# Patient Record
Sex: Female | Born: 1980 | Race: Black or African American | Hispanic: No | Marital: Single | Smoking: Never smoker
Health system: Southern US, Community
[De-identification: ages and names within clinical notes are randomized; demographics above are authoritative.]

## PROBLEM LIST (undated history)

## (undated) ENCOUNTER — Inpatient Hospital Stay (HOSPITAL_COMMUNITY): Payer: Self-pay

## (undated) DIAGNOSIS — O039 Complete or unspecified spontaneous abortion without complication: Secondary | ICD-10-CM

---

## 2013-04-04 ENCOUNTER — Other Ambulatory Visit: Payer: PRIVATE HEALTH INSURANCE

## 2013-04-04 DIAGNOSIS — Z3201 Encounter for pregnancy test, result positive: Secondary | ICD-10-CM

## 2013-04-05 LAB — OBSTETRIC PANEL
Antibody Screen: NEGATIVE
Eosinophils Absolute: 0 10*3/uL (ref 0.0–0.7)
Eosinophils Relative: 0 % (ref 0–5)
Lymphs Abs: 1.6 10*3/uL (ref 0.7–4.0)
MCH: 29.1 pg (ref 26.0–34.0)
MCV: 86.4 fL (ref 78.0–100.0)
Monocytes Absolute: 0.2 10*3/uL (ref 0.1–1.0)
Monocytes Relative: 6 % (ref 3–12)
Platelets: 207 10*3/uL (ref 150–400)
RBC: 3.74 MIL/uL — ABNORMAL LOW (ref 3.87–5.11)
Rh Type: POSITIVE

## 2013-04-05 LAB — HIV ANTIBODY (ROUTINE TESTING W REFLEX): HIV: NONREACTIVE

## 2013-04-25 ENCOUNTER — Encounter: Payer: Self-pay | Admitting: *Deleted

## 2013-05-02 ENCOUNTER — Encounter: Payer: PRIVATE HEALTH INSURANCE | Admitting: Family Medicine

## 2013-05-09 ENCOUNTER — Encounter: Payer: Self-pay | Admitting: Obstetrics and Gynecology

## 2013-05-09 ENCOUNTER — Ambulatory Visit (INDEPENDENT_AMBULATORY_CARE_PROVIDER_SITE_OTHER): Payer: PRIVATE HEALTH INSURANCE | Admitting: Family

## 2013-05-09 ENCOUNTER — Ambulatory Visit (HOSPITAL_COMMUNITY)
Admission: RE | Admit: 2013-05-09 | Discharge: 2013-05-09 | Disposition: A | Discharge status: Home / Self Care | Payer: Medicaid - Out of State | Source: Ambulatory Visit | Attending: Family | Admitting: Family

## 2013-05-09 VITALS — BP 106/71 | Temp 98.8°F | Ht 63.0 in | Wt 124.8 lb

## 2013-05-09 DIAGNOSIS — O021 Missed abortion: Secondary | ICD-10-CM | POA: Diagnosis not present

## 2013-05-09 DIAGNOSIS — O368390 Maternal care for abnormalities of the fetal heart rate or rhythm, unspecified trimester, not applicable or unspecified: Secondary | ICD-10-CM | POA: Diagnosis present

## 2013-05-09 DIAGNOSIS — Z3491 Encounter for supervision of normal pregnancy, unspecified, first trimester: Secondary | ICD-10-CM | POA: Insufficient documentation

## 2013-05-09 DIAGNOSIS — Z3480 Encounter for supervision of other normal pregnancy, unspecified trimester: Secondary | ICD-10-CM

## 2013-05-09 LAB — POCT URINALYSIS DIP (DEVICE)
Glucose, UA: NEGATIVE mg/dL
Nitrite: NEGATIVE
Urobilinogen, UA: 0.2 mg/dL (ref 0.0–1.0)

## 2013-05-09 MED ORDER — CONCEPT DHA 53.5-38-1 MG PO CAPS: 1.0000 | ORAL_CAPSULE | Freq: Every day | ORAL | Status: DC

## 2013-05-09 NOTE — Progress Notes (Signed)
P=96 . Here for new ob.  Given new patient information. Discussed bmi/ appropriate weight gain. C/o legs tired sometimes when in bed. C/o heartburn

## 2013-05-09 NOTE — Progress Notes (Signed)
Called to ultrasound for fetal demise at 9.6 weeks.  Consulted with Dr. Macon Large, may do expectant management or schedule for D&E.  After discussing with patient and significant other they decide for expectant management.  Will schedule follow-up appt in clinic in 10 days with plan to schedule D&E if no bleeding has occurred.  Bleeding precautions given.  OBSTETRIC <14 WK ULTRASOUND  Technique: Transabdominal ultrasound was performed for evaluation  of the gestation as well as the maternal uterus and adnexal  regions.  Comparison: None.  Intrauterine gestational sac: Visualized/normal in shape.  Yolk sac: Visualized  Embryo: Visualized  Cardiac Activity: Not visualized  Heart Rate: Zero bpm  CRL: 29 mm 9 w 6 d [place] EDC: 12/06/2013  Maternal uterus/Adnexae:  The ovaries are normal.  IMPRESSION:  Intrauterine fetal demise at 9 weeks 6 days.

## 2013-05-09 NOTE — Progress Notes (Signed)
New OB visit; reviewed practice information and OB visit schedule; discussed genetic screening > declined.  New OB labs obtained with pap and GC/CT.  Unable to auscultate heart tones with doppler> sent to [facility] for scan (unable to visualize heart tones> to ultrasound for scan.    Exam   BP 106/71  Temp(Src) 98.8 F (37.1 C)  Ht 5\' 3"  (1.6 m)  Wt 124 lb 12.8 oz (56.609 kg)  BMI 22.11 kg/m2  LMP 02/25/2013 Uterine Size: size equals dates  Pelvic Exam:    Perineum: No Hemorrhoids, Normal Perineum   Vulva: normal   Vagina:  normal mucosa, normal discharge, no palpable nodules   pH: Not done   Cervix: no bleeding following Pap, no cervical motion tenderness and no lesions   Adnexa: normal adnexa and no mass, fullness, tenderness   Bony Pelvis: Adequate  System: Breast:  No nipple retraction or dimpling, No nipple discharge or bleeding, No axillary or supraclavicular adenopathy, Normal to palpation without dominant masses   Skin: normal coloration and turgor, no rashes    Neurologic: negative   Extremities: normal strength, tone, and muscle mass   HEENT neck supple with midline trachea and thyroid without masses   Mouth/Teeth mucous membranes moist, pharynx normal without lesions   Neck supple and no masses   Cardiovascular: regular rate and rhythm, no murmurs or gallops   Respiratory:  appears well, vitals normal, no respiratory distress, acyanotic, normal RR, neck free of mass or lymphadenopathy, chest clear, no wheezing, crepitations, rhonchi, normal symmetric air entry   Abdomen: soft, non-tender; bowel sounds normal; no masses,  no organomegaly   Urinary: urethral meatus normal

## 2013-05-09 NOTE — Progress Notes (Signed)
Attestation of Attending Supervision of Advanced Practitioner (PA/CNM/NP): Evaluation and management procedures were performed by the Advanced Practitioner under my supervision and collaboration.  I have reviewed the Advanced Practitioner's note and chart, and I agree with the management and plan.  Mahesh Sizemore, MD, FACOG Attending Obstetrician & Gynecologist Faculty Practice, [facility]  

## 2013-05-11 LAB — CULTURE, OB URINE: Colony Count: 9000

## 2013-05-16 ENCOUNTER — Encounter: Payer: Self-pay | Admitting: Family

## 2013-05-18 ENCOUNTER — Encounter: Payer: Self-pay | Admitting: Family

## 2013-05-18 ENCOUNTER — Ambulatory Visit (INDEPENDENT_AMBULATORY_CARE_PROVIDER_SITE_OTHER): Payer: PRIVATE HEALTH INSURANCE | Admitting: Family

## 2013-05-18 VITALS — BP 112/73 | HR 100 | Wt 124.1 lb

## 2013-05-18 DIAGNOSIS — O034 Incomplete spontaneous abortion without complication: Secondary | ICD-10-CM

## 2013-05-18 NOTE — Progress Notes (Signed)
Informal [place] for FHR per pt request =  IUP visualized, no cardiac activity observed, no FM observed.  Pt and significant other both viewed real-time [place] with me.

## 2013-05-18 NOTE — Progress Notes (Signed)
  Subjective:    Patient ID: Kristine Howard, female    DOB: October 12, 1980, 32 y.o.   MRN: [id number]  HPI Pt is here for follow-up.  Seen on 05/09/13 and diagnosed with 9.6 wk fetal demise.  Pt desired to have expectant management.  Here today with no report of vaginal bleeding or cramping since last visit.  Pt verbalized uncertainty with proceeding with with D&E, desires to check FHR one additional time.   Review of Systems Pertinent information in HPI    Objective:   Physical Exam  Constitutional: She is oriented to person, place, and time. She appears well-developed and well-nourished. No distress.  HENT:  Head: Normocephalic and atraumatic.  Neck: Normal range of motion. Neck supple. No thyromegaly present.  Abdominal: Bowel sounds are normal.  Neurological: She is alert and oriented to person, place, and time.  Skin: Skin is warm and dry.   See ultrasound note per [facility] (no fetal heart rate)       Assessment & Plan:  9.6 week fetal demise  Plan: Explained to patient. Schedule D&E for next week. Message routed to [place] MUHAMMAD,Asriel Westrup

## 2013-05-18 NOTE — Progress Notes (Signed)
Patient states that she has had no bleeding, or passed any tissue. She does not have any pain.

## 2013-05-21 ENCOUNTER — Encounter (HOSPITAL_COMMUNITY): Payer: Self-pay | Admitting: Anesthesiology

## 2013-05-22 ENCOUNTER — Other Ambulatory Visit (HOSPITAL_COMMUNITY): Payer: Self-pay | Admitting: Obstetrics and Gynecology

## 2013-05-22 ENCOUNTER — Encounter (HOSPITAL_COMMUNITY): Payer: Self-pay | Admitting: Anesthesiology

## 2013-05-22 ENCOUNTER — Encounter (HOSPITAL_COMMUNITY)
Admission: RE | Disposition: A | Discharge status: Home / Self Care | Payer: Self-pay | Source: Ambulatory Visit | Attending: Obstetrics and Gynecology

## 2013-05-22 ENCOUNTER — Ambulatory Visit (HOSPITAL_COMMUNITY)
Admission: RE | Admit: 2013-05-22 | Discharge: 2013-05-22 | Disposition: A | Discharge status: Home / Self Care | Payer: PRIVATE HEALTH INSURANCE | Source: Ambulatory Visit | Attending: Obstetrics and Gynecology | Admitting: Obstetrics and Gynecology

## 2013-05-22 ENCOUNTER — Ambulatory Visit (HOSPITAL_COMMUNITY)
Admission: RE | Admit: 2013-05-22 | Discharge: 2013-05-22 | Disposition: A | Discharge status: Home / Self Care | Payer: Medicaid Other | Source: Ambulatory Visit | Attending: Obstetrics and Gynecology | Admitting: Obstetrics and Gynecology

## 2013-05-22 DIAGNOSIS — O021 Missed abortion: Secondary | ICD-10-CM

## 2013-05-22 SURGERY — DILATION AND EVACUATION, UTERUS

## 2013-05-22 MED ORDER — CHLOROPROCAINE HCL 1 % IJ SOLN
INTRAMUSCULAR | Status: DC | PRN
  Administered 2013-05-22: 30 mL

## 2013-05-22 MED ORDER — KETOROLAC TROMETHAMINE 30 MG/ML IJ SOLN
INTRAMUSCULAR | Status: AC
  Filled 2013-05-22: qty 1

## 2013-05-22 MED ORDER — FENTANYL CITRATE 0.05 MG/ML IJ SOLN
INTRAMUSCULAR | Status: AC
  Filled 2013-05-22: qty 2

## 2013-05-22 MED ORDER — PROPOFOL 10 MG/ML IV EMUL
INTRAVENOUS | Status: AC
  Filled 2013-05-22: qty 20

## 2013-05-22 MED ORDER — ONDANSETRON HCL 4 MG/2ML IJ SOLN
INTRAMUSCULAR | Status: AC
  Filled 2013-05-22: qty 2

## 2013-05-22 MED ORDER — MIDAZOLAM HCL 2 MG/2ML IJ SOLN
INTRAMUSCULAR | Status: AC
  Filled 2013-05-22: qty 2

## 2013-05-22 MED ORDER — CHLOROPROCAINE HCL 1 % IJ SOLN
INTRAMUSCULAR | Status: AC
  Filled 2013-05-22: qty 30

## 2013-05-22 MED ORDER — DOXYCYCLINE HYCLATE 100 MG IV SOLR
200.0000 mg | Freq: Once | INTRAVENOUS | Status: DC
  Filled 2013-05-22: qty 200

## 2013-05-22 MED ORDER — LIDOCAINE HCL (CARDIAC) 20 MG/ML IV SOLN
INTRAVENOUS | Status: AC
  Filled 2013-05-22: qty 5

## 2013-05-22 SURGICAL SUPPLY — 19 items
CATH ROBINSON RED A/P 16FR (CATHETERS) IMPLANT
DECANTER SPIKE VIAL GLASS SM (MISCELLANEOUS) IMPLANT
GLOVE BIOGEL PI IND STRL 6.5 (GLOVE) IMPLANT
GLOVE BIOGEL PI INDICATOR 6.5 (GLOVE)
GLOVE SURG SS PI 6.0 STRL IVOR (GLOVE) IMPLANT
GOWN STRL REIN XL XLG (GOWN DISPOSABLE) IMPLANT
KIT BERKELEY 1ST TRIMESTER 3/8 (MISCELLANEOUS) IMPLANT
NEEDLE SPNL 22GX3.5 QUINCKE BK (NEEDLE) IMPLANT
NS IRRIG 1000ML POUR BTL (IV SOLUTION) IMPLANT
PACK VAGINAL MINOR WOMEN LF (CUSTOM PROCEDURE TRAY) IMPLANT
PAD OB MATERNITY 4.3X12.25 (PERSONAL CARE ITEMS) IMPLANT
PAD PREP 24X48 CUFFED NSTRL (MISCELLANEOUS) IMPLANT
SET BERKELEY SUCTION TUBING (SUCTIONS) IMPLANT
SYR CONTROL 10ML LL (SYRINGE) IMPLANT
TOWEL OR 17X24 6PK STRL BLUE (TOWEL DISPOSABLE) IMPLANT
VACURETTE 10 RIGID CVD (CANNULA) IMPLANT
VACURETTE 7MM CVD STRL WRAP (CANNULA) IMPLANT
VACURETTE 8 RIGID CVD (CANNULA) IMPLANT
VACURETTE 9 RIGID CVD (CANNULA) IMPLANT

## 2013-05-22 NOTE — H&P (Addendum)
Kristine Howard is an 32 y.o. female G1P0010 diagnosed with missed AB on 9/17 at [redacted] weeks GA. Patient failed expectant management without any vaginal bleeding. Patient opted for surgical intervention with D&E on 9/26. Patient requested to have repeat ultrasound performed to confirm missed AB  Pertinent Gynecological History: Menses: flow is moderate Bleeding: monthly Contraception: none DES exposure: denies Blood transfusions: none Sexually transmitted diseases: no past history Previous GYN Procedures: none  Last mammogram: n/a  OB History: G1, P0010   Menstrual History:  Patient's last menstrual period was 02/25/2013.    No past medical history on file.  No past surgical history on file.  No family history on file.  Social History:  reports that she has never smoked. She has never used smokeless tobacco. She reports that she does not drink alcohol or use illicit drugs.  Allergies: No Known Allergies  Prescriptions prior to admission  Medication Sig Dispense Refill  . Prenat-FeFum-FePo-FA-Omega 3 (CONCEPT DHA) 53.5-38-1 MG CAPS Take 1 tablet by mouth daily.  30 capsule  3    Review of Systems  All other systems reviewed and are negative.    Last menstrual period 02/25/2013. Physical Exam   No results found for this or any previous visit (from the past 24 hour(s)).  No results found.  Assessment/Plan: 32 yo G1P0010 with 9 week missed ab here for D&E Following ultrasound which confirmed missed ab (CRL [redacted]w[redacted]d without cardiac activity), patient opted to leave without having procedure done. Spoke with patient's partner in hallway who was unable to convince patient to return in order for me to discuss available treatment options for missed Ab. Patient will be contacted by office staff to discuss options versus rescheduling D&E.  Astou Lada 05/22/2013, 8:46 AM

## 2013-05-24 ENCOUNTER — Telehealth: Payer: Self-pay | Admitting: *Deleted

## 2013-05-24 NOTE — Telephone Encounter (Signed)
[facility] and discussed with her that her provider wants her to come in to be seen since she did not get her surgery.  We discussed she will need an exam and provider will discuss her treatment options.  She denies any bleeding or problems at present and agrees to appointment and plan.

## 2013-05-24 NOTE — Telephone Encounter (Signed)
Message copied by Gerome Apley on Thu May 24, 2013  1:15 PM ------      Message from: CONSTANT, Gigi Gin      Created: Tue May 22, 2013  9:13 AM       Patient opted not to have D&E done today (diagnosed with a 9 week missed abortion on 9/17). She left hospital 30 minutes prior to procedure without speaking to a provider. Please contact patient and schedule appointment in clinic to discuss treatment options for missed abortion. If patient desires to have D&E re-scheduled, she should still be seen by a provider before rescheduling.            Thanks            Clinical cytogeneticist ------

## 2013-05-30 ENCOUNTER — Ambulatory Visit (INDEPENDENT_AMBULATORY_CARE_PROVIDER_SITE_OTHER): Payer: Medicaid Other | Admitting: Obstetrics & Gynecology

## 2013-05-30 ENCOUNTER — Encounter: Payer: Self-pay | Admitting: Obstetrics & Gynecology

## 2013-05-30 VITALS — BP 107/74 | HR 111 | Temp 98.9°F | Ht 65.0 in | Wt 125.5 lb

## 2013-05-30 DIAGNOSIS — O021 Missed abortion: Secondary | ICD-10-CM

## 2013-05-30 MED ORDER — MISOPROSTOL 200 MCG PO TABS: 800.0000 ug | ORAL_TABLET | Freq: Once | ORAL | Status: DC

## 2013-05-30 MED ORDER — IBUPROFEN 800 MG PO TABS: 800.0000 mg | ORAL_TABLET | Freq: Three times a day (TID) | ORAL | Status: DC | PRN

## 2013-05-30 NOTE — Patient Instructions (Signed)
Incomplete Miscarriage  Miscarriages in pregnancy are common. A miscarriage is a pregnancy that has ended before the twentieth week. You have had an incomplete miscarriage. Partial parts of the fetus or placenta (afterbirth) remain behind. Sometimes further treatment is needed. The most common reason for further treatment is continued bleeding (hemorrhage). Tissue left behind may also become infected. Treatment usually is curettage. Curettage for an incomplete abortion is a procedure in which the remaining products of pregnancy are removed. This can be done by a simple sucking procedure (suction curettage). It can also be done by a simple scraping (curettage) of the inside of the uterus (womb). This may be done in the hospital or in the caregiver's office. This is only done when your caregiver knows the pregnancy has ended. This is determined by physical examination and a negative pregnancy test. It may also include an ultrasound to confirm a dead fetus. The ultrasound may also prove that products of the pregnancy remain in the uterus.  If your cervix remains dilated and you are still passing clots and tissue, your caregiver may wish to watch you for a little while. Your caregiver may want to see if you are going to finish passing all of the remaining parts of the pregnancy. If the bleeding continues, they may proceed with curettage.  WHY DO I FEEL THIS WAY  Miscarriages can be a very emotional time for prospective mothers. This is not you or your partner's fault. The miscarriage did not occur because of a lack in you or your partner. Nearly all miscarriages occur because the pregnancy has started off wrongly. At least half of miscarried pregnancies have a chromosomal abnormality (almost always not inherited). Others may have developmental problems with the fetus or placentas. Problems may not show up even when the products miscarried are studied under the microscope. You can usually begin trying for another  pregnancy as soon as your caregiver says it's okay.  HOME CARE INSTRUCTIONS    Your caregiver may order bed rest (this means only getting up to use the bathroom). Your caregiver may allow you to continue light activity. If curettage was not done at this time, but you require further treatment.   Keep track of the number of pads you use each day. Keep track of how saturated (soaked) they are. Record this information.   Do not use tampons. Do not douche or have sexual intercourse until approved by your caregiver.   It is very important to keep all follow-up appointments for re-evaluation and continuing management.   Women who have an Rh negative blood type (ie, A, B, AB, or O negative) need to receive a drug called Rh(D) immune globulin. This medicine helps protect future fetuses against problems that can occur if an Rh negative mother is carrying a baby who is Rh positive.  SEEK IMMEDIATE MEDICAL CARE IF:    You experience severe cramps in your stomach, back, or abdomen.   You run an unexplained temperature (record these).   You pass large clots or tissue (save any tissue for your caregiver to inspect).   Your bleeding increases or you become light-headed, weak, or have fainting episodes.  MAKE SURE YOU:    Understand these instructions.   Will watch your condition.   Will get help right away if you are not doing well or get worse.  Document Released: 08/09/2005 Document Revised: 11/01/2011 Document Reviewed: 03/29/2008  ExitCare Patient Information 2014 [employer].

## 2013-05-30 NOTE — Progress Notes (Signed)
Subjective:     Patient ID: Kristine Howard, female   DOB: Jul 25, 1981, 32 y.o.   MRN: [id number]  HPI Pt was seen in the MAU and diagnosed with a missed abortion at [facility].  She has had sono x2 which confirmed a missed AB about 9 weeks.  Pt ~13weeks by dates.  Pt left MAU without seeing provider.  She has no pregnancy sx at present.     Review of Systems     Objective:   Physical Exam BP 107/74  Pulse 111  Temp(Src) 98.9 F (37.2 C)  Ht 5\' 5"  (1.651 m)  Wt 125 lb 8 oz (56.926 kg)  BMI 20.88 kg/m2  LMP 02/25/2013 Pt in NAD Uterus 7-8 weeks sized     Assessment:     Missed abortion- d/w pt the option of D&C vs cytotec.  Reviewed with her the risks      Plan:     cytotec per vagina x1 repeat in 12hours if no passage of tissue. Motrin 800mg  po q 8 hours prn pain F/u in 2 weeks or sooner prn

## 2013-06-15 ENCOUNTER — Ambulatory Visit: Payer: PRIVATE HEALTH INSURANCE | Admitting: Obstetrics & Gynecology

## 2013-06-19 ENCOUNTER — Other Ambulatory Visit (HOSPITAL_COMMUNITY): Payer: Self-pay | Admitting: Obstetrics

## 2013-06-22 ENCOUNTER — Other Ambulatory Visit (HOSPITAL_COMMUNITY): Payer: Self-pay | Admitting: Obstetrics

## 2013-06-22 ENCOUNTER — Ambulatory Visit (HOSPITAL_COMMUNITY)
Admission: RE | Admit: 2013-06-22 | Discharge: 2013-06-22 | Disposition: A | Discharge status: Home / Self Care | Payer: Medicaid Other | Source: Ambulatory Visit | Attending: Obstetrics | Admitting: Obstetrics

## 2013-06-22 DIAGNOSIS — O021 Missed abortion: Secondary | ICD-10-CM | POA: Insufficient documentation

## 2013-08-03 ENCOUNTER — Inpatient Hospital Stay (HOSPITAL_COMMUNITY): Payer: Medicaid Other

## 2013-08-03 ENCOUNTER — Encounter (HOSPITAL_COMMUNITY): Payer: Self-pay

## 2013-08-03 ENCOUNTER — Inpatient Hospital Stay (HOSPITAL_COMMUNITY)
Admission: AD | Admit: 2013-08-03 | Discharge: 2013-08-03 | Disposition: A | Discharge status: Home / Self Care | Payer: Medicaid Other | Source: Ambulatory Visit | Attending: Obstetrics and Gynecology | Admitting: Obstetrics and Gynecology

## 2013-08-03 DIAGNOSIS — O039 Complete or unspecified spontaneous abortion without complication: Secondary | ICD-10-CM | POA: Insufficient documentation

## 2013-08-03 LAB — CBC
HCT: 31.6 % — ABNORMAL LOW (ref 36.0–46.0)
Hemoglobin: 11.4 g/dL — ABNORMAL LOW (ref 12.0–15.0)
MCHC: 36.1 g/dL — ABNORMAL HIGH (ref 30.0–36.0)
MCV: 82.3 fL (ref 78.0–100.0)
Platelets: 156 10*3/uL (ref 150–400)
RBC: 3.84 MIL/uL — ABNORMAL LOW (ref 3.87–5.11)
RDW: 12.6 % (ref 11.5–15.5)
WBC: 5.4 10*3/uL (ref 4.0–10.5)

## 2013-08-03 MED ORDER — LACTATED RINGERS IV BOLUS (SEPSIS)
1000.0000 mL | Freq: Once | INTRAVENOUS | Status: AC
  Administered 2013-08-03: 1000 mL via INTRAVENOUS

## 2013-08-03 MED ORDER — BUTORPHANOL TARTRATE 1 MG/ML IJ SOLN
1.0000 mg | Freq: Once | INTRAMUSCULAR | Status: AC
  Administered 2013-08-03: 1 mg via INTRAVENOUS
  Filled 2013-08-03: qty 1

## 2013-08-03 MED ORDER — PROMETHAZINE HCL 25 MG/ML IJ SOLN
12.5000 mg | Freq: Once | INTRAMUSCULAR | Status: AC
  Administered 2013-08-03: 12.5 mg via INTRAVENOUS
  Filled 2013-08-03: qty 1

## 2013-08-03 NOTE — MAU Provider Note (Signed)
History     CSN: [id number]  Arrival date and time: 08/03/13 0348   None     Chief Complaint  Patient presents with  . Abdominal Cramping   HPI This is a 32 y.o. G1P0 who presents at [redacted]w[redacted]d by LMP with report of missed abortion, s/p cytotec treatment this evening. Was seen here in September for Missed abortion with fetal pole of 27mm with no cardiac activity. Tried conservative management and failed, so came in for [facility] in September but left before procedure done. Was seen again in Clinic in October to confirm, and [place] again saw a 23mm fetus with no heartbeat. She agreed toCytotec at that time but evidently never took it. Was seen in [facility] for New OB today and again was told she had an 8 wks size Missed abortion. Took Cytotec and came here with pain.   RN Note:  Pt arrived via EMS stating she was seen in office yesterday. Was told that baby did not have heartbeat and given RX for cytotec to be taken at home as well as ibuprofen and phenergan. Pt took cytotec around 2030 and began having some cramping. Pt took ibuprofen at 2300. Cramping became severe around 0000. Small amount of vag bleeding.        OB History   Grav Para Term Preterm Abortions TAB SAB Ect Mult Living   1 0   0  0   0      History reviewed. No pertinent past medical history.  History reviewed. No pertinent past surgical history.  History reviewed. No pertinent family history.  History  Substance Use Topics  . Smoking status: Never Smoker   . Smokeless tobacco: Never Used  . Alcohol Use: No    Allergies: No Known Allergies  Prescriptions prior to admission  Medication Sig Dispense Refill  . ibuprofen (ADVIL,MOTRIN) 800 MG tablet Take 1 tablet (800 mg total) by mouth every 8 (eight) hours as needed for pain.  20 tablet  0  . misoprostol (CYTOTEC) 200 MCG tablet Take 4 tablets (800 mcg total) by mouth once.  8 tablet  0  . promethazine (PHENERGAN) 25 MG tablet Take 25 mg by mouth every 6 (six) hours as  needed for nausea or vomiting.      . Prenat-FeFum-FePo-FA-Omega 3 (CONCEPT DHA) 53.5-38-1 MG CAPS Take 1 tablet by mouth daily.  30 capsule  3    Review of Systems  Constitutional: Negative for fever, chills and malaise/fatigue.  Gastrointestinal: Positive for abdominal pain. Negative for nausea, vomiting, diarrhea and constipation.       Bleeding   Genitourinary: Negative for dysuria.  Neurological: Negative for dizziness.   Physical Exam   Blood pressure 122/63, pulse 90, temperature 98.3 F (36.8 C), temperature source Oral, resp. rate 24, last menstrual period 02/25/2013, SpO2 100.00%.  Physical Exam  Constitutional: She is oriented to person, place, and time. She appears well-developed and well-nourished. No distress.  HENT:  Head: Normocephalic.  Cardiovascular: Normal rate.   Respiratory: Effort normal.  GI: Soft. She exhibits no distension and no mass. There is tenderness (slight over SP area). There is no rebound.  Genitourinary: Uterus normal. Vaginal discharge (small amt of clotted blood, Cervix long and closed) found.  Musculoskeletal: Normal range of motion.  Neurological: She is alert and oriented to person, place, and time.  Skin: Skin is warm and dry.  Psychiatric: She has a normal mood and affect.    MAU Course  Procedures  MDM   Assessment and  Plan  A   SIUP at [redacted]w[redacted]d by LMP, with known Missed Abortion from September at 9 wks  P:  Ultrasound ordered      Report to Gevena Barre CNM  [facility] 08/03/2013, 5:34 AM   Addendum: at 0600  S: Pt denies any pain, light bleeding now  O: VSS  A: completed miscarriage after vaginal cytotec this evening  US Ob Transvaginal  08/03/2013   CLINICAL DATA:  Pelvic cramping.  Status post Cytotec.  EXAM: TRANSVAGINAL OB ULTRASOUND  TECHNIQUE: Transvaginal ultrasound was performed for complete evaluation of the gestation as well as the maternal uterus, adnexal regions, and pelvic cul-de-sac.  COMPARISON:  Pelvic  ultrasound performed 06/22/2013  FINDINGS: Intrauterine gestational sac:  None seen.  Yolk sac:  N/A  Embryo:  N/A  Maternal uterus/adnexae: The uterus is unremarkable in appearance. There is no evidence for retained products of conception.  The ovaries are unremarkable in appearance. The right ovary measures 4.6 x 3.2 x 2.7 cm, while the left ovary measures 2.9 x 1.2 x 1.2 cm. A 3.7 cm right adnexal cyst is likely physiologic in nature. No suspicious adnexal masses are seen.  A small amount of free fluid is seen within the pelvic cul-de-sac.  IMPRESSION: 1. No evidence for retained products of conception. No intrauterine gestational sac seen. 2. 3.7 cm right adnexal cyst is likely physiologic in nature.   Electronically Signed   By: Roanna Raider M.D.   On: 08/03/2013 05:50   P: discharge home, rv'd bleeding precautions Has f/u visit next Friday Pt stable ready for discharge  S.Clinton Dragone, CNM  P:

## 2013-08-03 NOTE — MAU Note (Signed)
Pt states that when she went to the bathroom, she felt like something had passed, but could not see anything because it had gone down the toilet. States that she feels better now.

## 2013-08-03 NOTE — MAU Note (Signed)
Pt arrived via EMS stating she was seen in office yesterday. Was told that baby did not have heartbeat and given RX for cytotec to be taken at home as well as ibuprofen and phenergan. Pt took cytotec around 2030 and began having some cramping. Pt took ibuprofen at 2300. Cramping became severe around 0000. Small amount of vag bleeding.

## 2014-01-13 ENCOUNTER — Encounter (HOSPITAL_COMMUNITY): Payer: Self-pay

## 2014-01-13 ENCOUNTER — Inpatient Hospital Stay (HOSPITAL_COMMUNITY): Payer: Medicaid Other

## 2014-01-13 ENCOUNTER — Inpatient Hospital Stay (HOSPITAL_COMMUNITY)
Admission: AD | Admit: 2014-01-13 | Discharge: 2014-01-13 | Disposition: A | Discharge status: Home / Self Care | Payer: Medicaid Other | Source: Ambulatory Visit | Attending: Obstetrics and Gynecology | Admitting: Obstetrics and Gynecology

## 2014-01-13 DIAGNOSIS — O209 Hemorrhage in early pregnancy, unspecified: Secondary | ICD-10-CM | POA: Insufficient documentation

## 2014-01-13 DIAGNOSIS — R109 Unspecified abdominal pain: Secondary | ICD-10-CM | POA: Insufficient documentation

## 2014-01-13 HISTORY — DX: Complete or unspecified spontaneous abortion without complication: O03.9

## 2014-01-13 LAB — CBC
HCT: 34 % — ABNORMAL LOW (ref 36.0–46.0)
Hemoglobin: 12.2 g/dL (ref 12.0–15.0)
MCH: 29.8 pg (ref 26.0–34.0)
MCHC: 35.9 g/dL (ref 30.0–36.0)
MCV: 82.9 fL (ref 78.0–100.0)
Platelets: 188 10*3/uL (ref 150–400)
RBC: 4.1 MIL/uL (ref 3.87–5.11)
RDW: 13 % (ref 11.5–15.5)
WBC: 4.4 10*3/uL (ref 4.0–10.5)

## 2014-01-13 LAB — WET PREP, GENITAL
CLUE CELLS WET PREP: NONE SEEN
TRICH WET PREP: NONE SEEN

## 2014-01-13 LAB — HCG, QUANTITATIVE, PREGNANCY: hCG, Beta Chain, Quant, S: 4932 m[IU]/mL — ABNORMAL HIGH (ref <5)

## 2014-01-13 NOTE — Discharge Instructions (Signed)
Recurrent Miscarriage  Recurrent miscarriage means that a woman has lost two or more pregnancies in a row. The loss happens before 20 weeks of pregnancy. Primary recurrent miscarriage is with a woman that has never been able to give birth to a child. Secondary recurrent miscarriage is a woman who had a child and then had two or more miscarriages in a row.   CAUSES   · Your parents passed it to you (genetic).  · Chromosomal defects.  · Endocrine disorders. A person's endocrine system includes glands that make hormones.  · Having a lack of progesterone hormone in early pregnancy.  · Thyroid problems.  · Insulin resistance seen in diabetic women and women with Polycystic Ovary Syndrome that is hard to control.  · Abnormalities of the uterus.  · Certain viral and germ (bacterial) infections.  · Autoimmune disorders. This is when the immune system attacks or destroys healthy body tissue.  · Smoking, drinking or taking drugs or medicines.  · Being exposed to certain chemicals or toxins at home or work.  HOME CARE INSTRUCTIONS   · See your caregiver if you have two or more miscarriages.  · Follow the advice for testing and treatment that your caregiver recommends.  · Discuss any concerns or questions with your caregiver.  · Do not smoke or drink alcohol when pregnant.  · Recurrent miscarriages can have a severe emotional and psychological effect on a couple wanting to have children. They may have feelings of anger, blame, guilt and become depressed after losing a pregnancy many times. Join a support group or see a grief counselor if you have emotional problems because of pregnancy loss.  SEEK MEDICAL CARE IF:   · You are or think you are pregnant and have any kind of vaginal spotting or bleeding.  · You develop low abdominal cramps.  · You develop a fever of 102° F (38.9° C) or higher.  · You develop abnormal vaginal discharge.  · You have been or think you have been exposed to a spreadable (contagious) illness, toxins or  chemicals that make you sick to your stomach (nauseated) or throw up (vomit).  · You think you have been exposed to a sexually transmitted disease.  Document Released: 01/26/2008 Document Revised: 11/01/2011 Document Reviewed: 01/26/2008  ExitCare® Patient Information ©2014 [employer].

## 2014-01-13 NOTE — MAU Provider Note (Signed)
History   33yo, G2P0010 at [redacted]w[redacted]d presents with c/o passing clots.  Pt experienced bleeding last Monday (5/19) and was seen in the office for U/S, wet prep, progesterone levels, and urine culture - the plan was for her to return in 10 days for repeat u/s.  Pt reports that she started spotting last night and then today began cramping and passed 2 quarter size clots after which the cramping stopped.  Denies recent fever, resp or GI c/o's, UTI s/s.  Blood type A pos.     Patient Active Problem List   Diagnosis Date Noted  . Supervision of normal pregnancy in first trimester 05/09/2013    Chief Complaint  Patient presents with  . Vaginal Bleeding   HPI  OB History   Grav Para Term Preterm Abortions TAB SAB Ect Mult Living   1 0   0  0   0      No past medical history on file.  No past surgical history on file.  No family history on file.  History  Substance Use Topics  . Smoking status: Never Smoker   . Smokeless tobacco: Never Used  . Alcohol Use: No    Allergies: No Known Allergies  No prescriptions prior to admission    ROS ROS: see HPI above, all other systems are negative  Physical Exam   Last menstrual period 02/25/2013, unknown if currently breastfeeding.  Physical Exam Chest: Clear Heart: RRR Abdomen: gravid, NT Extremities: WNL  Pelvic - small amount of old blood noted in vault and on cervix. Wiped away w/ fox swab. No evidence of active bleeding. No vaginal lacerations. Cervix visually closed.      ED Course  IUP at [redacted]w[redacted]d by LMP confirmed by  [redacted]w[redacted]d u/s Bleeding/cramping/clots H/o recent SAB  Labs U/S - pending Reported off to on-coming CNM to continue care.     Haroldine Laws CNM, MSN 01/13/2014 5:40 PM

## 2014-01-13 NOTE — MAU Note (Signed)
Pt states went to MD office last week and fetal HR was low on u/s, has been spotting for a week. Today passed two quarter sized clots. Bleeding small amount now, smears on pad in MAU.

## 2014-01-13 NOTE — MAU Note (Signed)
Assumed care of patient.

## 2014-01-13 NOTE — Progress Notes (Signed)
S: pt denies any pain, having scant bleeding  O: US shows empty uterus, no GS, YS, FP,  Ovaries WNL  A: Spontaneous miscarriage  P: emotional support to pt Reassurance given that nothing different she could have done F/u office this week for further w/u, and repeat quants weekly until zero  US Ob Comp Less 14 Wks  01/13/2014   CLINICAL DATA:  Early pregnancy, spotting, clots, quantitative beta HCG 4,932 ; EGA [redacted] weeks 4 days by LMP ; office ultrasound 01/08/2014 demonstrated fetal pole with low heart rate  EXAM: TRANSVAGINAL OB ULTRASOUND; OBSTETRIC <14 WK ULTRASOUND  TECHNIQUE: Transabdominal and transvaginal ultrasound was performed for complete evaluation of the gestation as well as the maternal uterus, adnexal regions, and pelvic cul-de-sac.  COMPARISON:  None for this pregnancy  FINDINGS: Intrauterine gestational sac: Not identified  Yolk sac:  N/A  Embryo:  N/A  Cardiac Activity: N/A  Heart Rate: N/A bpm  Maternal uterus/adnexae:  No gestational sac seen.  Uterus is retroverted without focal mass.  Inhomogeneous endometrial complex with motion of contents on real-time imaging/cine loop.  No free pelvic fluid.  LEFT ovary normal size and morphology, 1.7 x 3.0 x 0.8 cm.  RIGHT ovary normal size and morphology 2.7 x 1.9 x 1.8 cm.  No adnexal masses.  IMPRESSION: No intrauterine gestational sac identified.  Recent office obstetric ultrasound reportedly demonstrated a gestational sac with a fetal pole and fetal cardiac activity.  Assuming this is correct, findings on today's exam would represent spontaneous abortion.  This can be confirmed by comparison with a prior outside ultrasound or by serial quantitative beta HCG.   Electronically Signed   By: Ulyses Southward M.D.   On: 01/13/2014 19:42   US Ob Transvaginal  01/13/2014   CLINICAL DATA:  Early pregnancy, spotting, clots, quantitative beta HCG 4,932 ; EGA [redacted] weeks 4 days by LMP ; office ultrasound 01/08/2014 demonstrated fetal pole with low heart rate   EXAM: TRANSVAGINAL OB ULTRASOUND; OBSTETRIC <14 WK ULTRASOUND  TECHNIQUE: Transabdominal and transvaginal ultrasound was performed for complete evaluation of the gestation as well as the maternal uterus, adnexal regions, and pelvic cul-de-sac.  COMPARISON:  None for this pregnancy  FINDINGS: Intrauterine gestational sac: Not identified  Yolk sac:  N/A  Embryo:  N/A  Cardiac Activity: N/A  Heart Rate: N/A bpm  Maternal uterus/adnexae:  No gestational sac seen.  Uterus is retroverted without focal mass.  Inhomogeneous endometrial complex with motion of contents on real-time imaging/cine loop.  No free pelvic fluid.  LEFT ovary normal size and morphology, 1.7 x 3.0 x 0.8 cm.  RIGHT ovary normal size and morphology 2.7 x 1.9 x 1.8 cm.  No adnexal masses.  IMPRESSION: No intrauterine gestational sac identified.  Recent office obstetric ultrasound reportedly demonstrated a gestational sac with a fetal pole and fetal cardiac activity.  Assuming this is correct, findings on today's exam would represent spontaneous abortion.  This can be confirmed by comparison with a prior outside ultrasound or by serial quantitative beta HCG.   Electronically Signed   By: Ulyses Southward M.D.   On: 01/13/2014 19:42   S.Leeann Must, CNM

## 2014-01-14 LAB — GC/CHLAMYDIA PROBE AMP
CT Probe RNA: NEGATIVE
GC PROBE AMP APTIMA: NEGATIVE

## 2014-03-12 IMAGING — US US OB COMP LESS 14 WK
1 series · 14 of 26 positions shown · non-contrast
Comparison: Ultrasound 05/09/2013

CLINICAL DATA: Reevaluate.  No fetal heart tones.

OBSTETRIC <14 WK ULTRASOUND
TECHNIQUE: Transabdominal ultrasound was performed for evaluation
of the gestation as well as the maternal uterus and adnexal
regions.

[Series 1: us ob comp less 14 wk · 26 acquisitions, 14 frames shown]
[im 1/26]
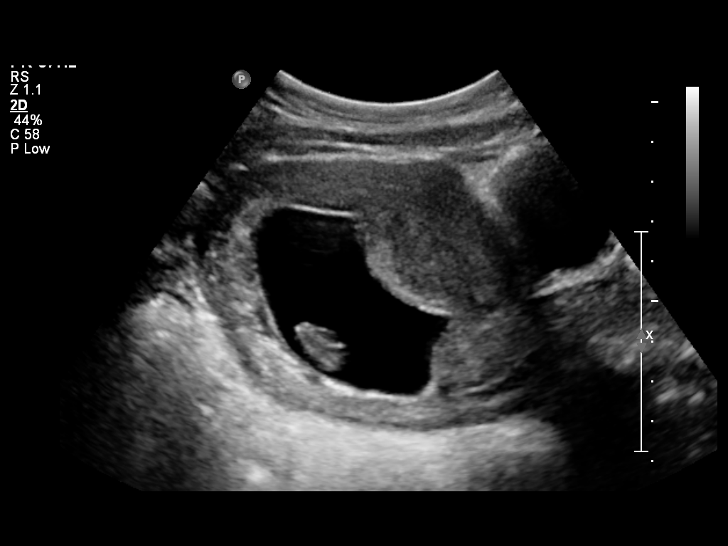
[im 3/26]
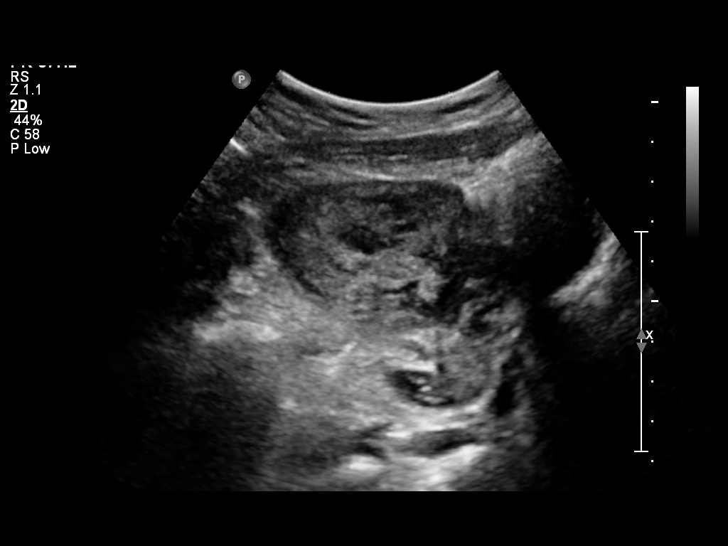
[im 5/26]
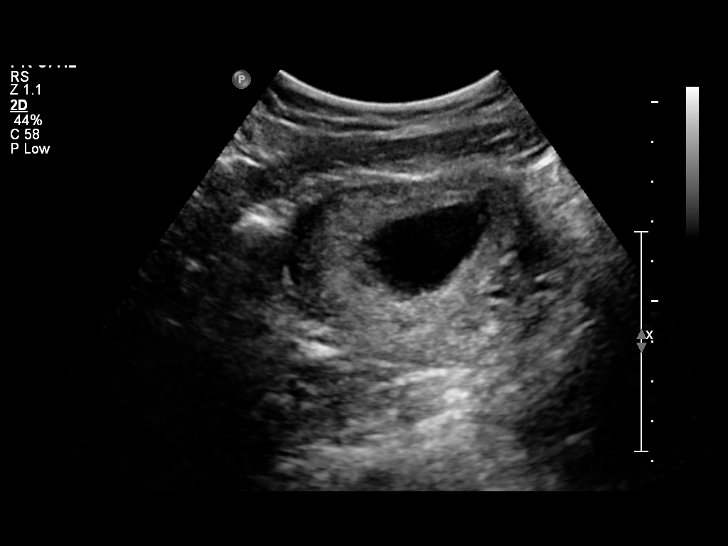
[im 7/26]
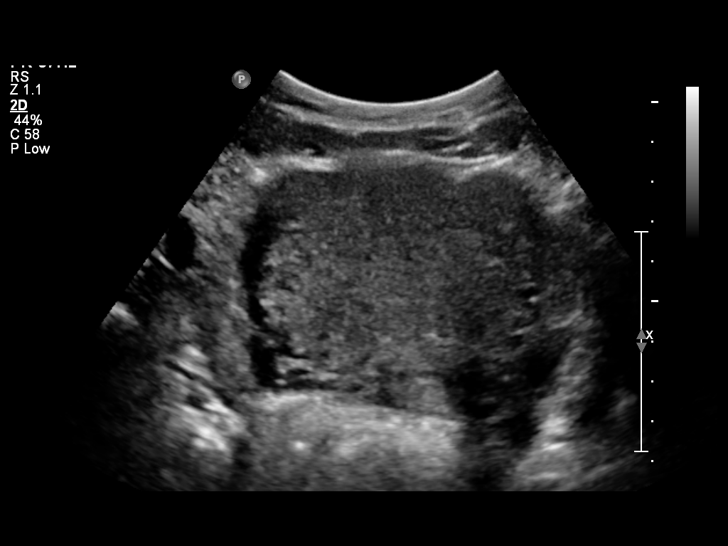
[im 9/26]
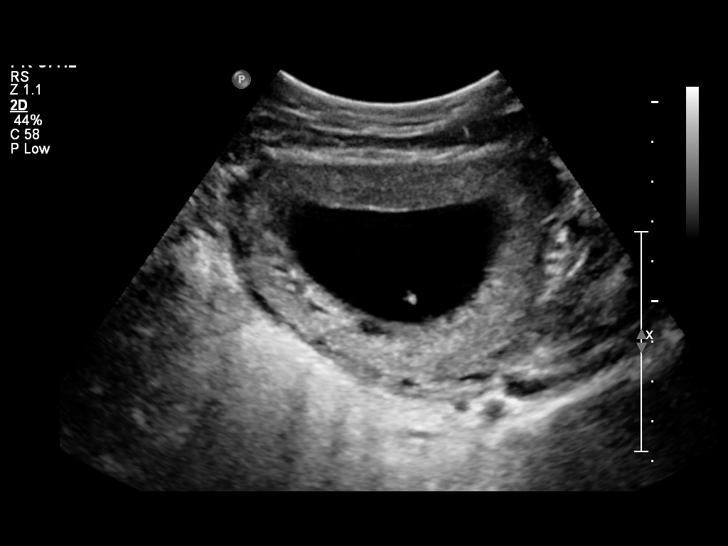
[im 11/26]
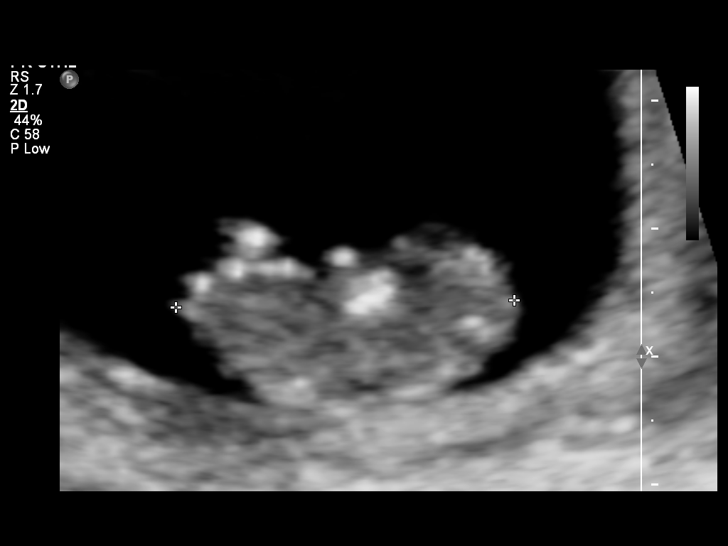
[im 13/26]
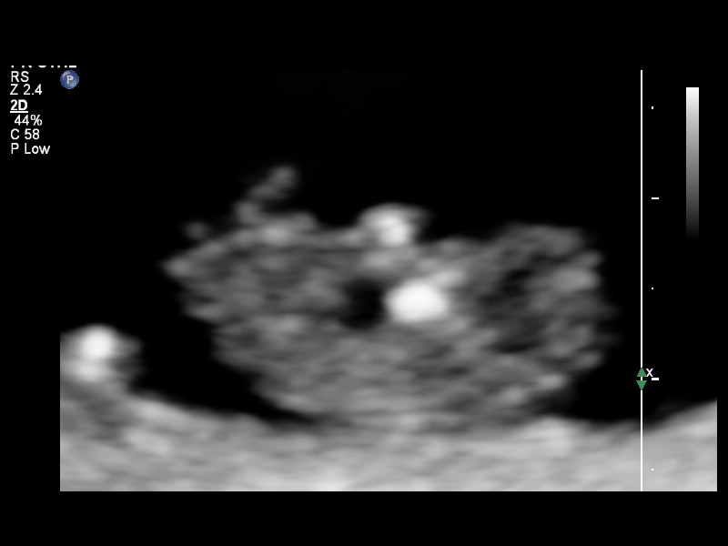
[im 14/26]
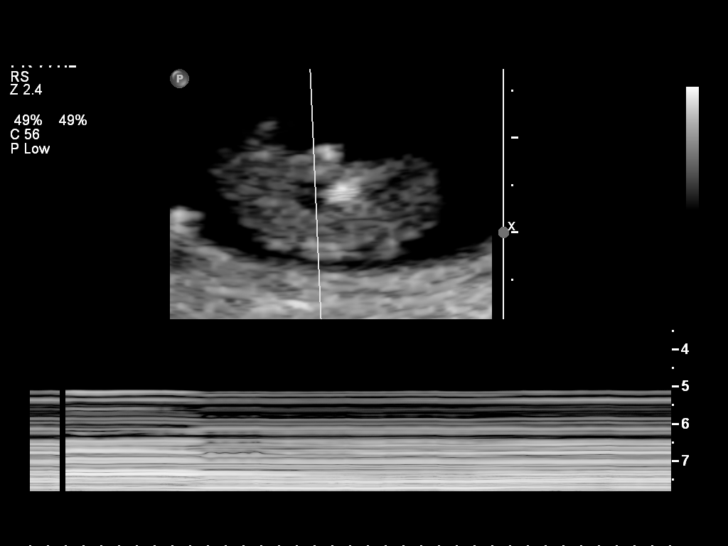
[im 16/26]
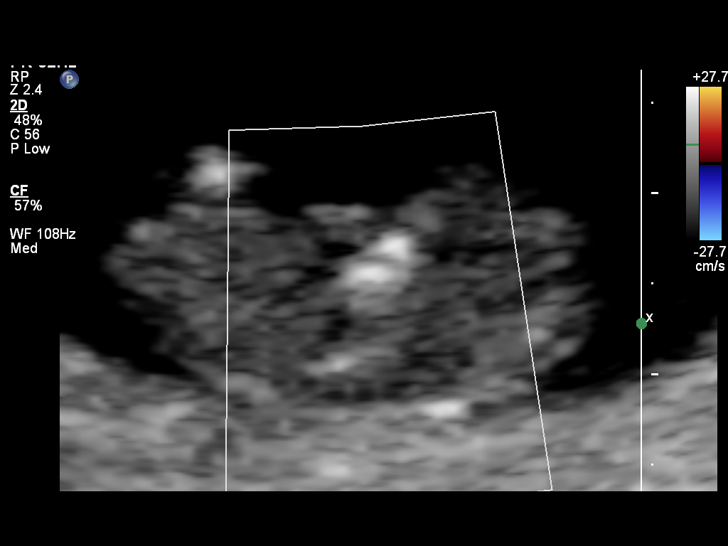
[im 18/26]
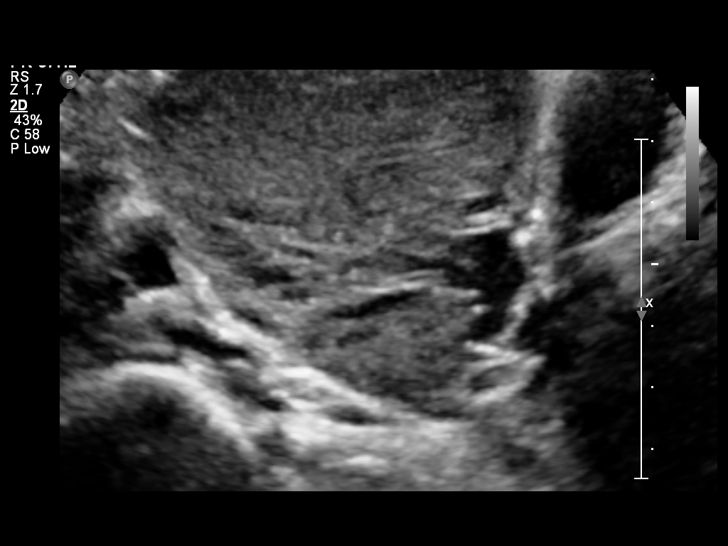
[im 20/26]
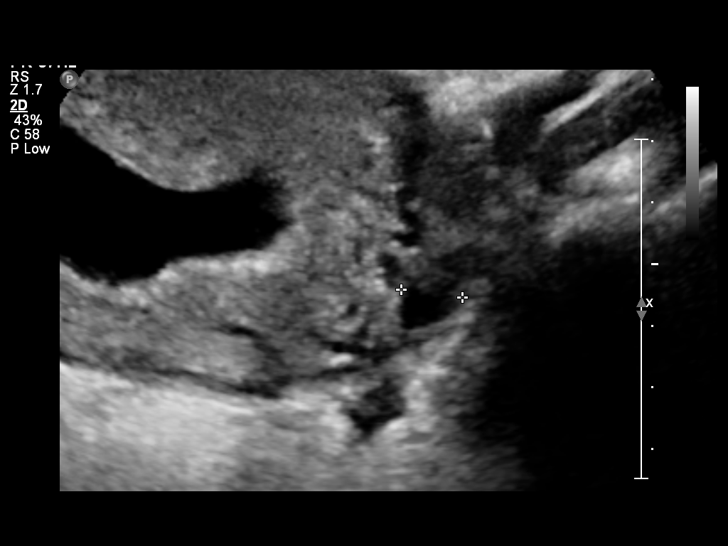
[im 22/26]
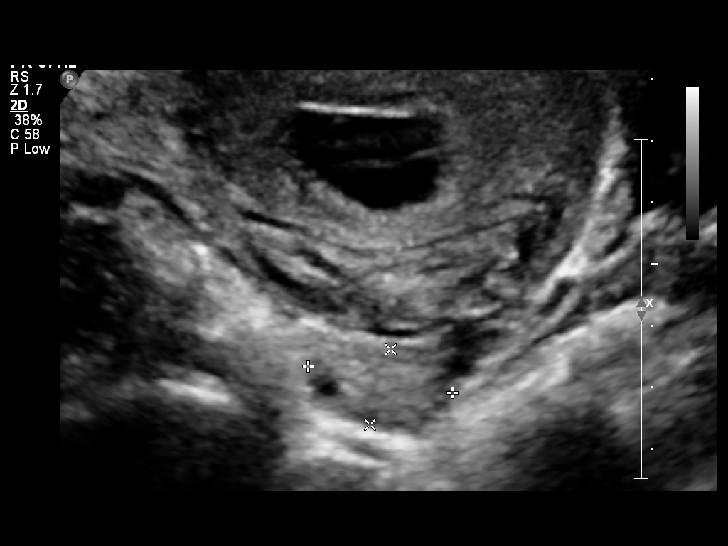
[im 24/26]
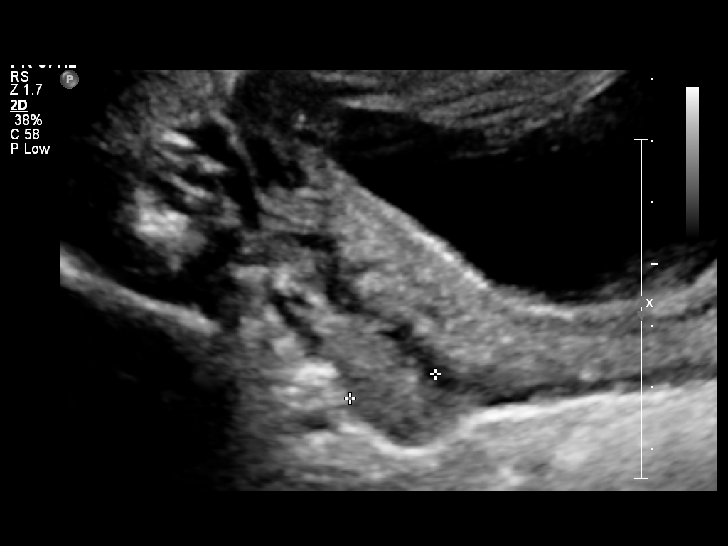
[im 26/26]
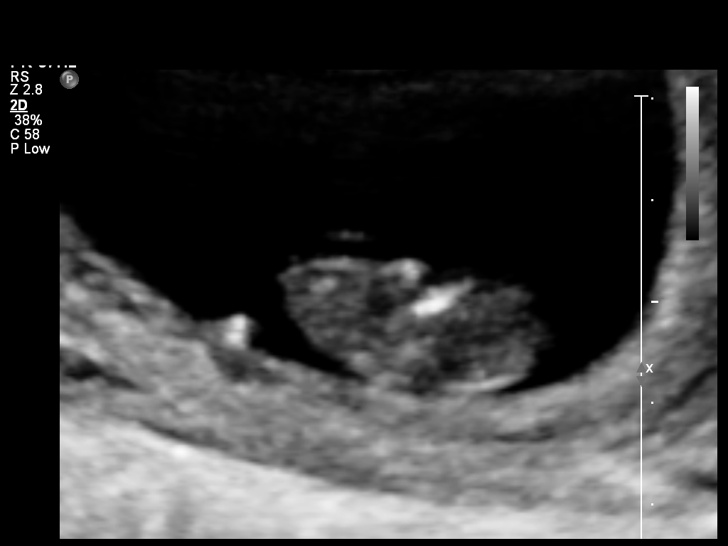

[14 of 26 positions shown; findings below may reference images not displayed]

Intrauterine gestational sac: Visualized/normal in shape.
Yolk sac: Not visualized
Embryo: Visualized
Cardiac Activity: Not visualized
Heart Rate: Zero bpm

CRL:  27 mm US EDC: 12/06/13

Maternal uterus/Adnexae:
The ovaries are normal.
IMPRESSION: Findings consistent with intrauterine fetal demise.

## 2014-05-24 IMAGING — US US OB TRANSVAGINAL
1 series · 14 of 28 positions shown · non-contrast
Comparison: Pelvic ultrasound performed 06/22/2013

CLINICAL DATA: Pelvic cramping.  Status post Cytotec.

EXAM:
TRANSVAGINAL OB ULTRASOUND
TECHNIQUE: Transvaginal ultrasound was performed for complete evaluation of the
gestation as well as the maternal uterus, adnexal regions, and
pelvic cul-de-sac.

[Series 1: us ob transvaginal · 14 of 33 slices shown]
[im 2/33]
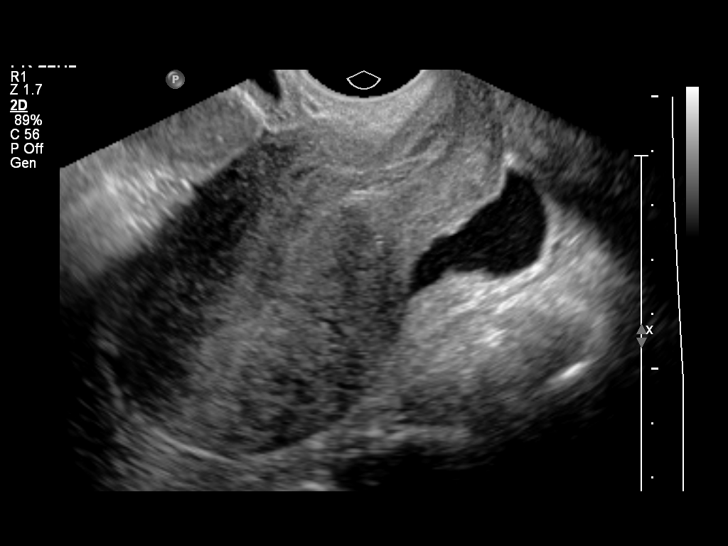
[im 4/33]
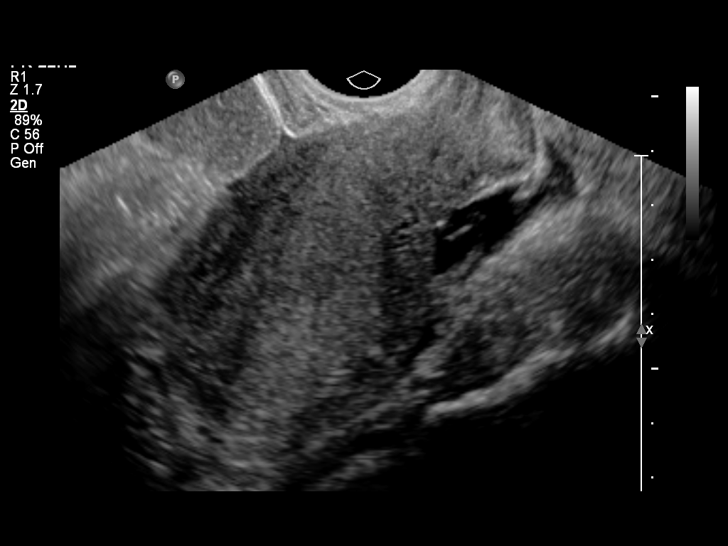
[im 6/33]
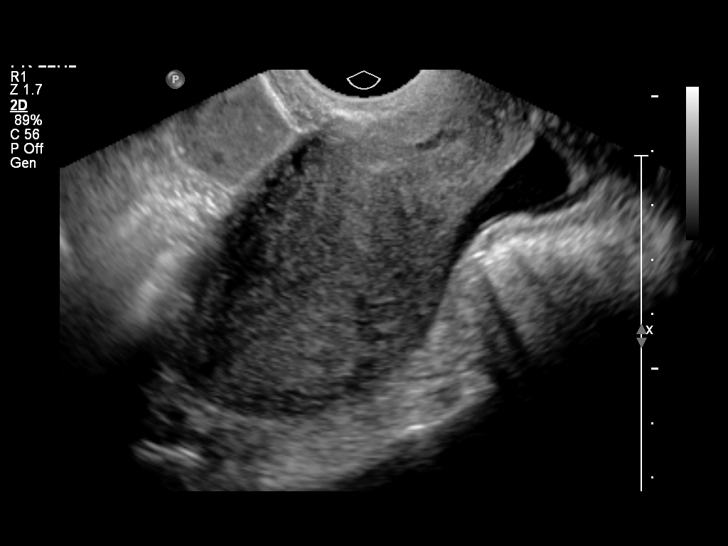
[im 9/33]
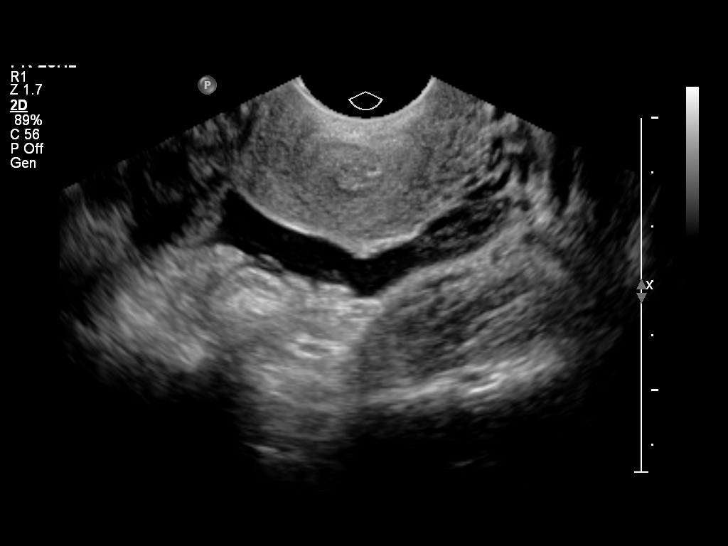
[im 11/33]
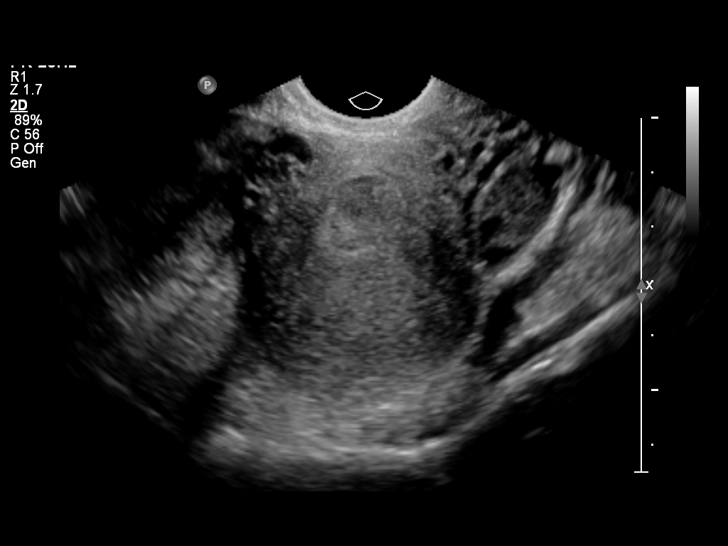
[im 14/33]
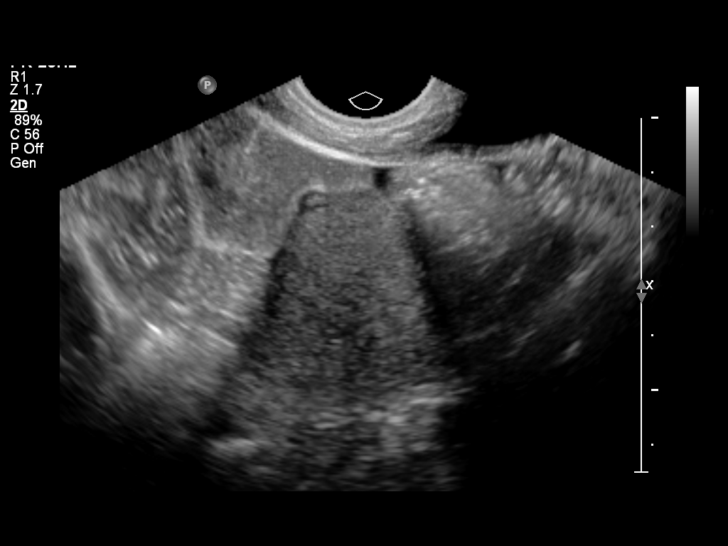
[im 16/33]
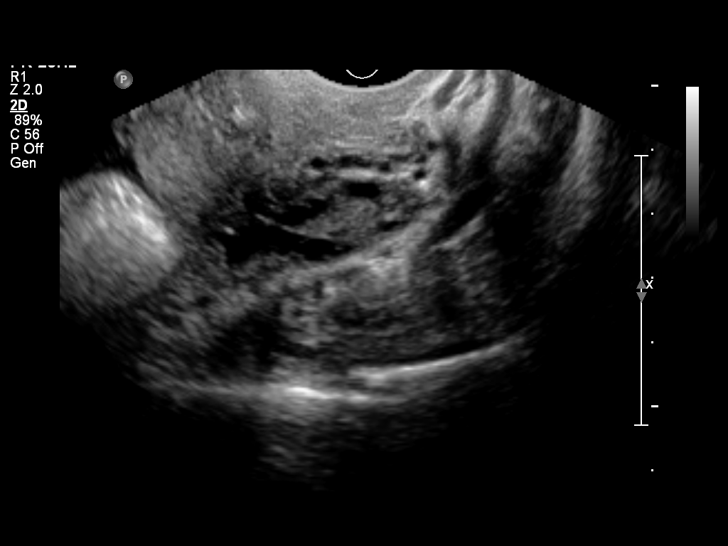
[im 18/33]
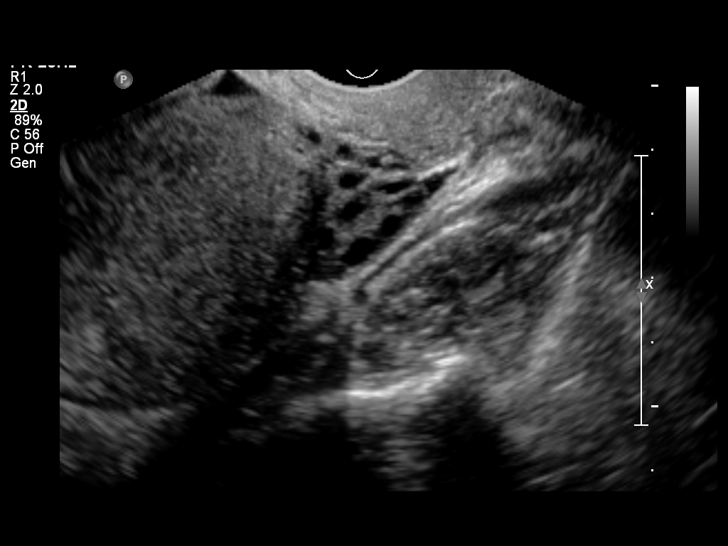
[im 21/33]
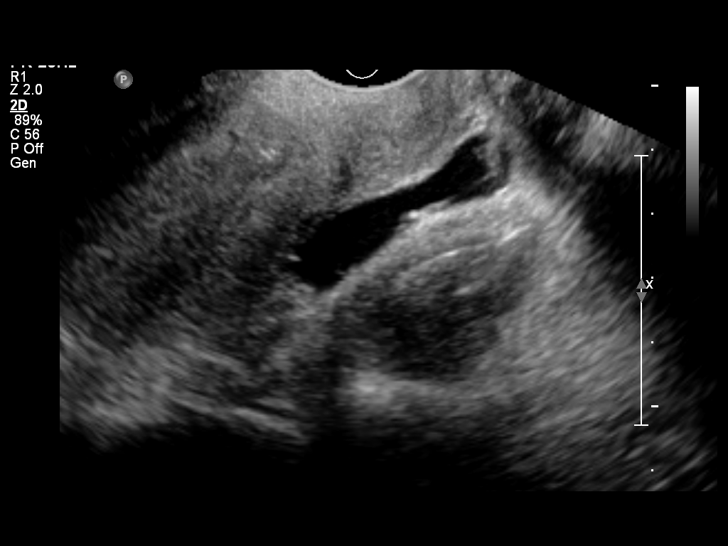
[im 23/33]
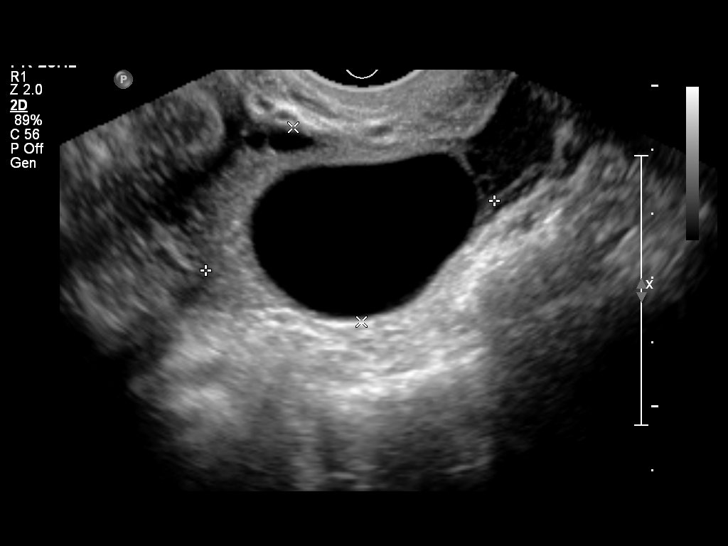
[im 25/33]
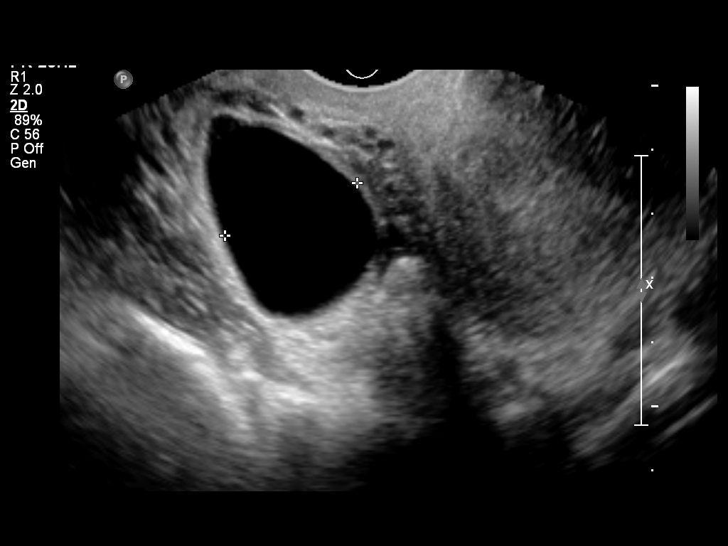
[im 28/33]
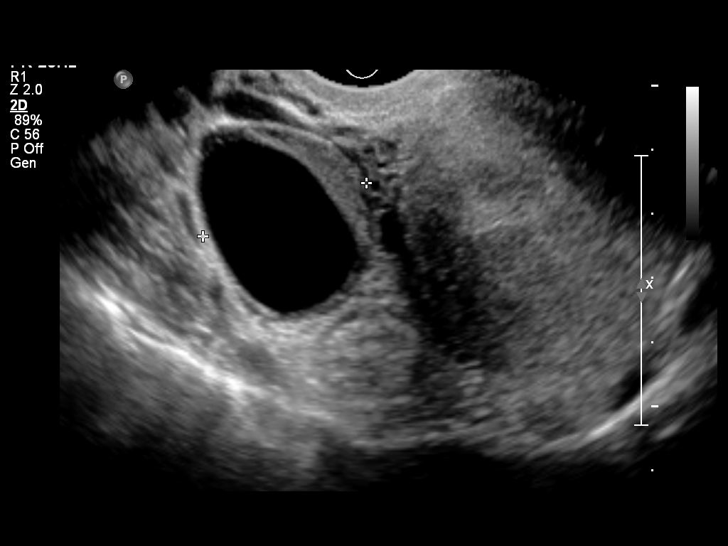
[im 30/33]
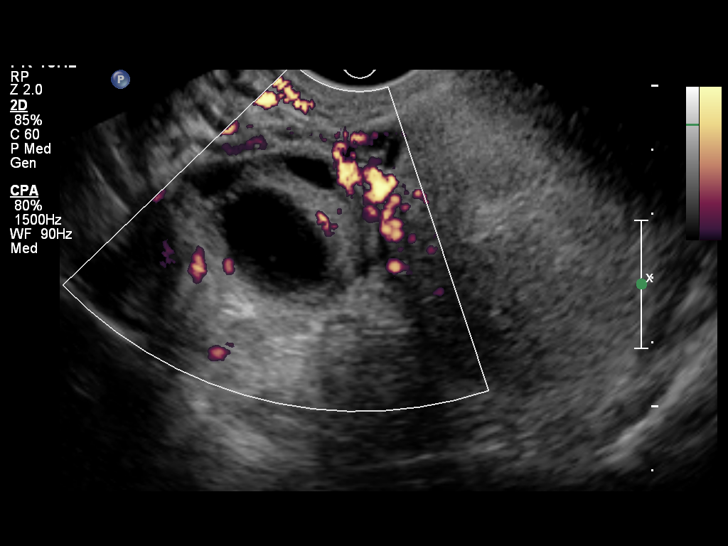
[im 33/33]
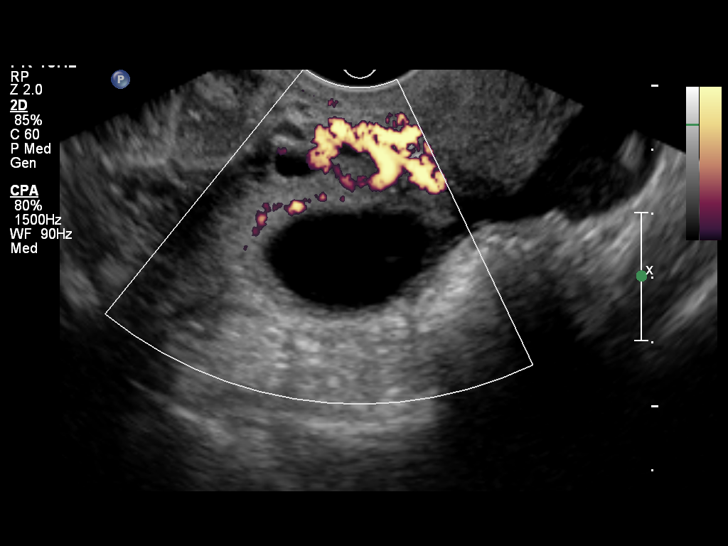

[14 of 28 positions shown; findings below may reference images not displayed]

FINDINGS: Intrauterine gestational sac:  None seen.

Yolk sac:  N/A

Embryo:  N/A

Maternal uterus/adnexae: The uterus is unremarkable in appearance.
There is no evidence for retained products of conception.

The ovaries are unremarkable in appearance. The right ovary measures
4.6 x 3.2 x 2.7 cm, while the left ovary measures 2.9 x 1.2 x
cm. A 3.7 cm right adnexal cyst is likely physiologic in nature. No
suspicious adnexal masses are seen.

A small amount of free fluid is seen within the pelvic cul-de-sac.
IMPRESSION: 1. No evidence for retained products of conception. No intrauterine
gestational sac seen.
2. 3.7 cm right adnexal cyst is likely physiologic in nature.

## 2014-06-24 ENCOUNTER — Encounter (HOSPITAL_COMMUNITY): Payer: Self-pay

## 2014-09-05 ENCOUNTER — Encounter (HOSPITAL_COMMUNITY): Payer: Self-pay | Admitting: Obstetrics and Gynecology

## 2014-11-03 IMAGING — US US OB TRANSVAGINAL
1 series · 13 of 28 positions shown · non-contrast
Comparison: None for this pregnancy

CLINICAL DATA: Early pregnancy, spotting, clots, quantitative beta
demonstrated fetal pole with low heart rate

EXAM:
TRANSVAGINAL OB ULTRASOUND; OBSTETRIC <14 WK ULTRASOUND
TECHNIQUE: Transabdominal and transvaginal ultrasound was performed for
complete evaluation of the gestation as well as the maternal uterus,
adnexal regions, and pelvic cul-de-sac.

[Series 1: us ob transvaginal · 49 acquisitions, 13 frames shown]
[im 2/49]
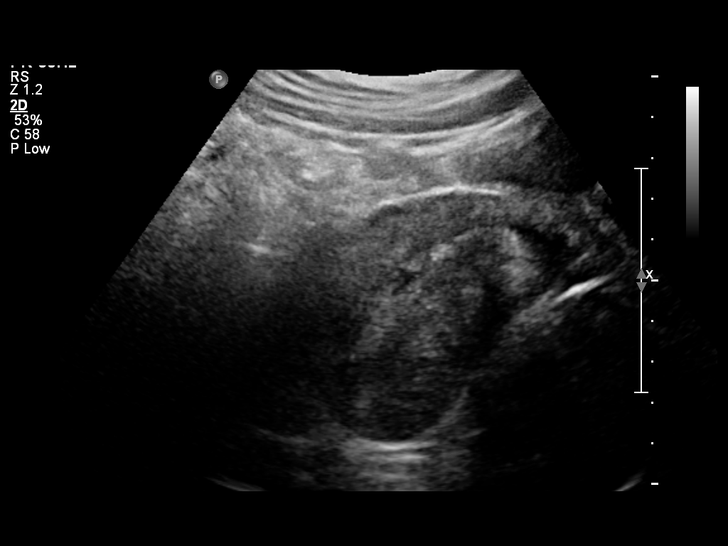
[im 6/49]
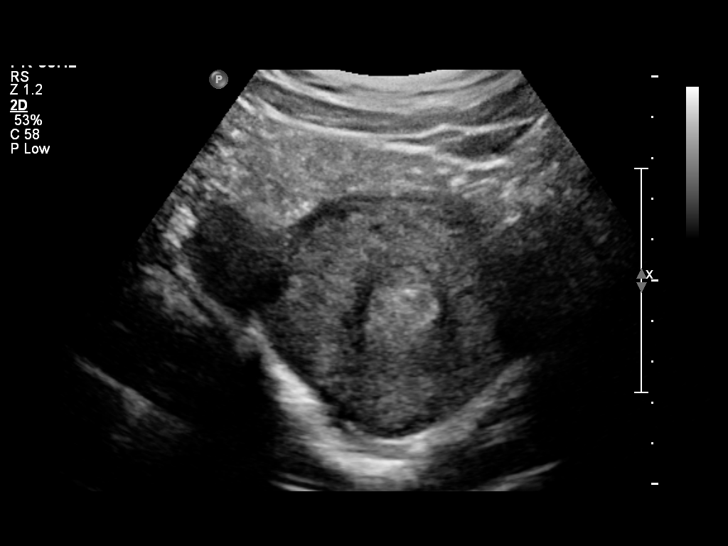
[im 9/49]
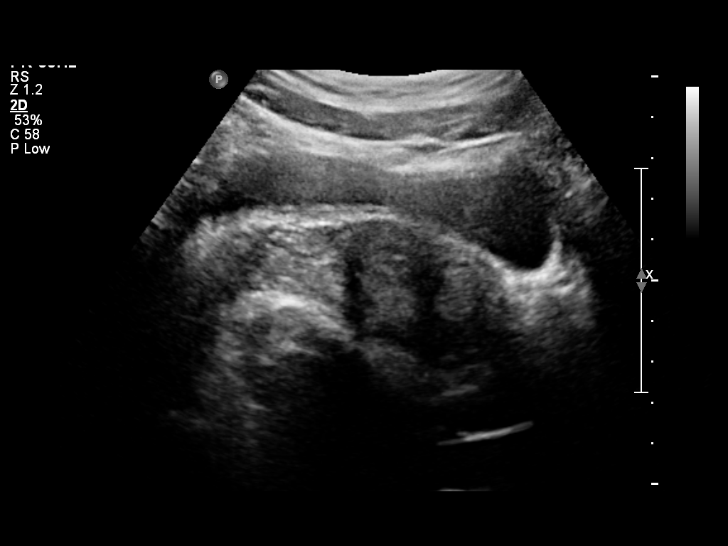
[im 13/49]
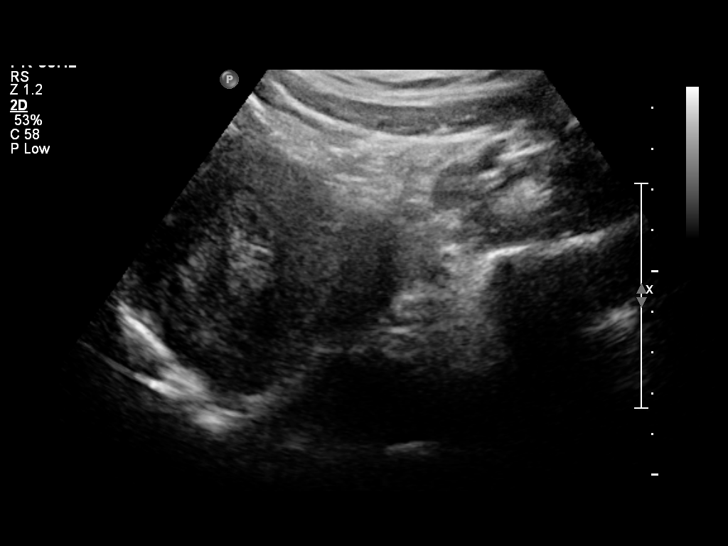
[im 17/49]
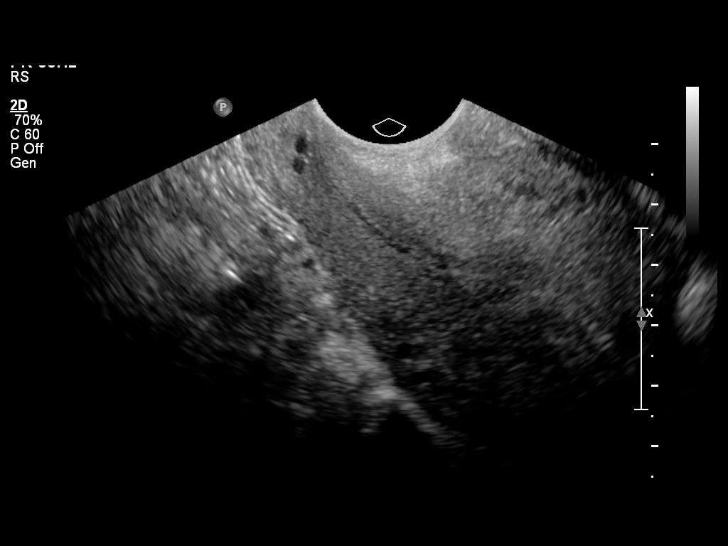
[im 20/49]
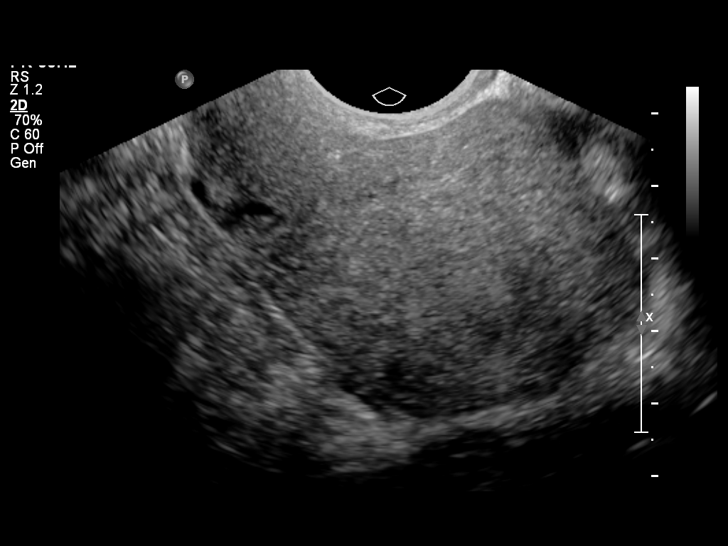
[im 25/49]
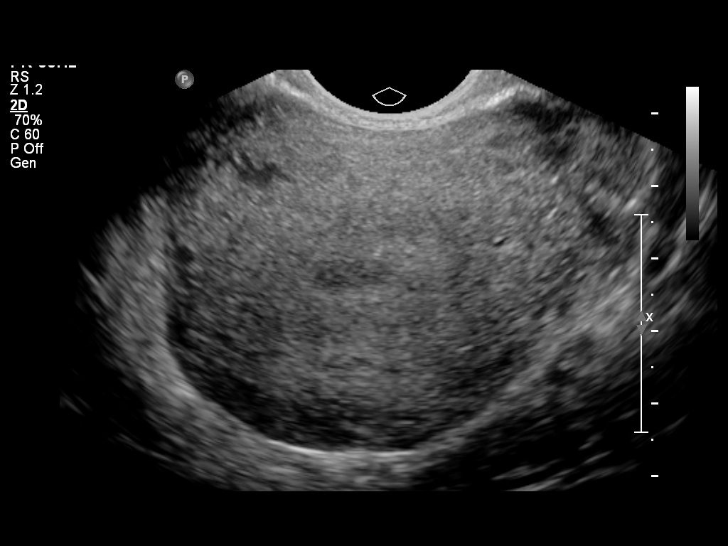
[im 29/49]
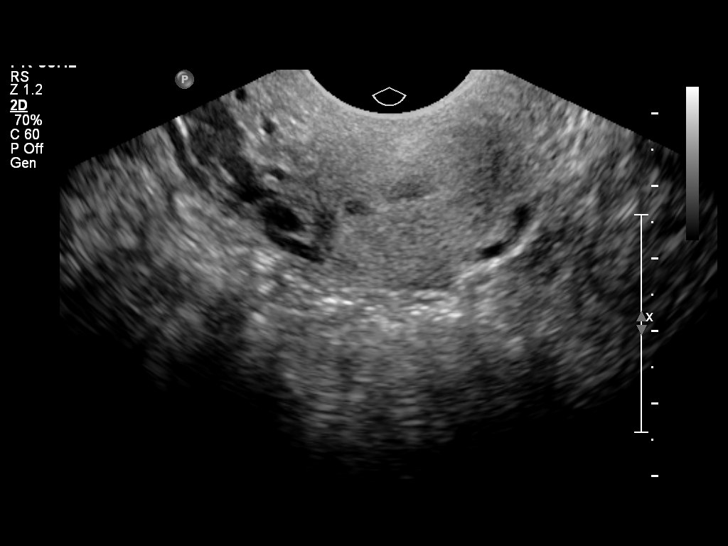
[im 33/49]
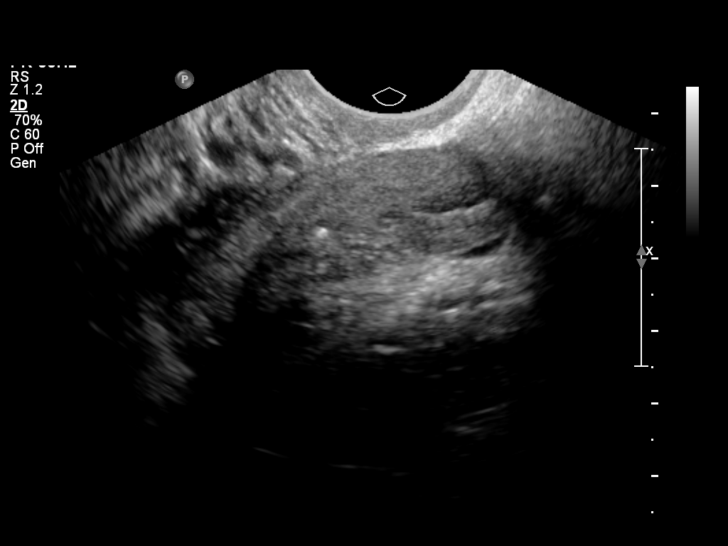
[im 36/49]
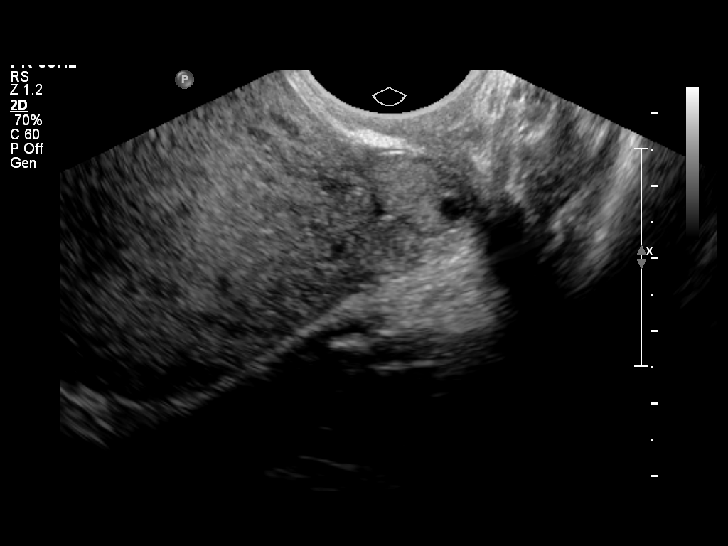
[im 40/49]
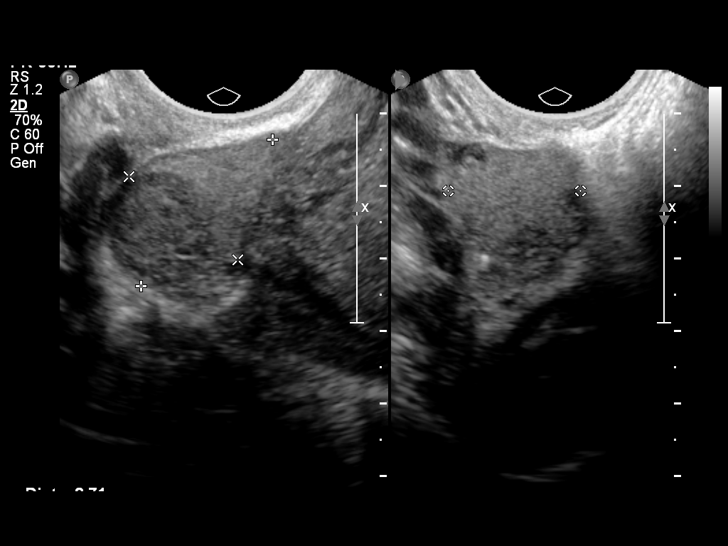
[im 43/49]
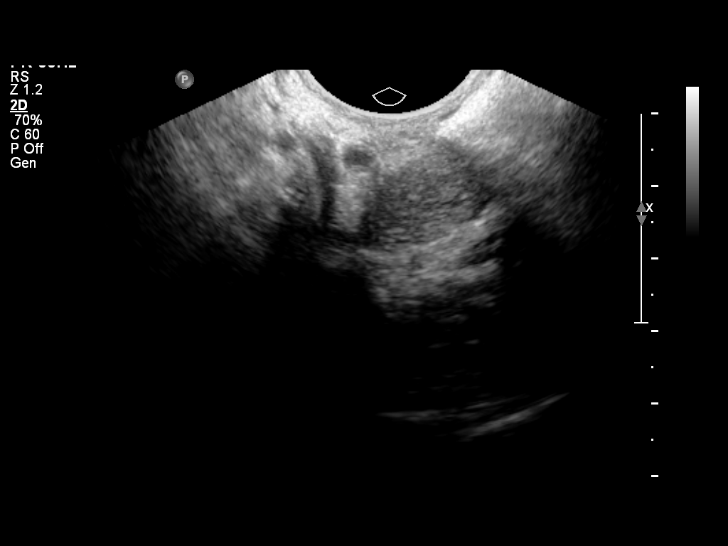
[im 47/49]
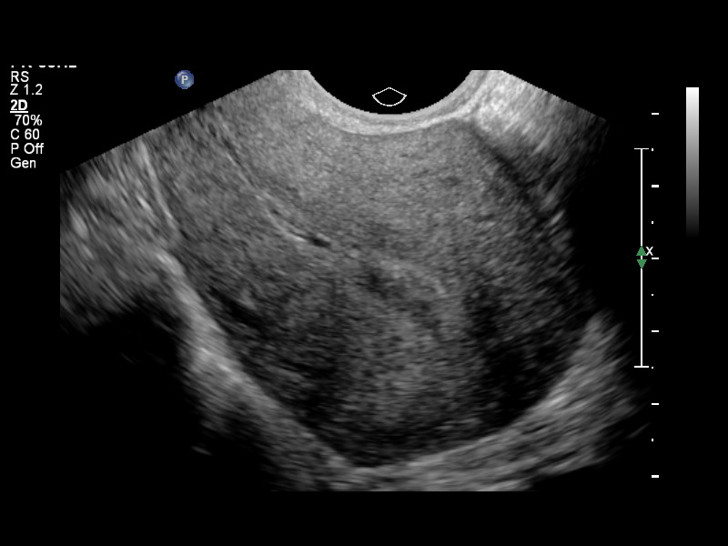

[13 of 28 positions shown; findings below may reference images not displayed]

FINDINGS: Intrauterine gestational sac: Not identified

Yolk sac:  N/A

Embryo:  N/A

Cardiac Activity: N/A

Heart Rate: N/A bpm

Maternal uterus/adnexae:

No gestational sac seen.

Uterus is retroverted without focal mass.

Inhomogeneous endometrial complex with motion of contents on
real-time imaging/cine loop.

No free pelvic fluid.

LEFT ovary normal size and morphology, 1.7 x 3.0 x 0.8 cm.

RIGHT ovary normal size and morphology 2.7 x 1.9 x 1.8 cm.

No adnexal masses.
IMPRESSION: No intrauterine gestational sac identified.

Recent office obstetric ultrasound reportedly demonstrated a
gestational sac with a fetal pole and fetal cardiac activity.

Assuming this is correct, findings on today's exam would represent
spontaneous abortion.

This can be confirmed by comparison with a prior outside ultrasound
or by serial quantitative beta HCG.

## 2014-11-04 ENCOUNTER — Observation Stay: Payer: Self-pay | Admitting: Obstetrics & Gynecology

## 2014-11-05 ENCOUNTER — Observation Stay: Payer: Self-pay | Admitting: Obstetrics and Gynecology

## 2014-11-06 ENCOUNTER — Inpatient Hospital Stay: Payer: Self-pay | Admitting: Obstetrics and Gynecology

## 2014-11-18 ENCOUNTER — Encounter (HOSPITAL_COMMUNITY): Payer: Self-pay | Admitting: *Deleted

## 2014-12-31 NOTE — H&P (Signed)
L&D Evaluation:  History Expanded:  HPI 34 yo G3 P0020 with EDD of 11/17/14 per LMP and early bedside US @ [facility]. Presents at 2057w2d with c/o contractions worsening since yesterday (was here w mild-mod contractions and cervix 1/80/-1). Denies LOF, VB or decreased FM. [facility]. A+/RI/Varicella unknown/GBS positive.   Gravida 3   Term 0   PreTerm 0   Abortion 2   Living 0   Blood Type (Maternal) A positive   Group B Strep Results Maternal (Result >5wks must be treated as unknown) positive   Maternal Varicella Unknown   Rubella Results (Maternal) immune   Presents with contractions   Patient's Medical History No Chronic Illness   Patient's Surgical History none   Medications Pre Natal Vitamins   Allergies NKDA   Social History none   Family History Non-Contributory   ROS:  ROS All systems were reviewed.  HEENT, CNS, GI, GU, Respiratory, CV, Renal and Musculoskeletal systems were found to be normal.   Exam:  Vital Signs stable   General no apparent distress   Mental Status clear   Abdomen gravid, tender with contractions   Estimated Fetal Weight Average for gestational age   Back no CVAT   Edema no edema   Pelvic no external lesions, 2/80/-1 per RN, x2 one hour apart   Mebranes Intact   FHT normal rate with no decels, category 1 tracing, baseline 150, mod variability, + accels   Ucx q 5-8 min   Skin no lesions   Impression:  Impression IUP at 38 wks, early vs false labor   Plan:  Plan EFM/NST, monitor contractions and for cervical change   Electronic Signatures: Letitia LibraHarris, Geniya Fulgham Paul (MD)  (Signed 15-Mar-16 12:51)  Authored: L&D Evaluation   Last Updated: 15-Mar-16 12:51 by Letitia LibraHarris, Taeko Schaffer Paul (MD)

## 2014-12-31 NOTE — H&P (Signed)
L&D Evaluation:  History Expanded:  HPI 34 yo G3 P0020 with EDD of 11/17/14 per LMP and early bedside US @ [facility]. Presents at 5346w1d with c/o contractions since this am. Denies LOF, VB or decreased FM. [facility] A+/RI/Varicella unknown/GBS positive.   Patient's Medical History No Chronic Illness   Patient's Surgical History none   Medications Pre Natal Vitamins   Allergies NKDA   Social History none   Exam:  Vital Signs stable   General no apparent distress   Mental Status clear   Abdomen gravid, tender with contractions   Pelvic no external lesions, 1/80/-1   Mebranes Intact   FHT normal rate with no decels, category 1 tracing, baseline 150, mod variability, + accels   Ucx q 10 min   Impression:  Impression IUP at 38 wks, not in labor   Plan:  Plan discharge   Comments labor precautions   Follow Up Appointment already scheduled. 3/15   Electronic Signatures: Marta AntuBrothers, Yogesh Cominsky K (CNM)  (Signed 14-Mar-16 14:01)  Authored: L&D Evaluation   Last Updated: 14-Mar-16 14:01 by Vella KohlerBrothers, Lawrence Mitch K (CNM)

## 2014-12-31 NOTE — H&P (Signed)
L&D Evaluation:  History:  HPI 34 yo G3 P0020 with EDD of 11/17/14 per LMP and early bedside US @ [facility]. Presents at 979w3d with c/o contractions worsening since 03/14 cervix at that time 1/80/-1, represented for prolonged rule out yesterday morning 3/15 and stable at 2.5/70/-2, before presenting for thir presentation this evening. Denies LOF, some spotting, +FM. [facility] A+/RI/Varicella unknown/GBS positive.   Presents with contractions   Patient's Medical History No Chronic Illness   Patient's Surgical History none   Medications Pre Natal Vitamins   Allergies NKDA   Social History none   Family History Non-Contributory   ROS:  ROS All systems were reviewed.  HEENT, CNS, GI, GU, Respiratory, CV, Renal and Musculoskeletal systems were found to be normal.   Exam:  Vital Signs stable   General no apparent distress   Mental Status clear   Abdomen gravid, tender with contractions   Estimated Fetal Weight Average for gestational age   Back no CVAT   Edema no edema   Pelvic no external lesions, 2/70-2   Mebranes Intact   FHT normal rate with no decels, category 1 tracing, baseline 150, mod variability, + accels   Ucx irregular   Skin no lesions   Impression:  Impression IUP at 38 wks 2 days continued symptomatic braxton hicks contractions   Plan:  Plan EFM/NST, monitor contractions and for cervical change   Comments 1) Early labor vs braxton hick -  patient extremely intoleranct of pelvic exams.  Will adminsiter 10mg  IM morphine as 3rd presentation, recheck in AM  2) Fetus - Category I tracing  3) Disposition - pending delivery   Electronic Signatures: Lorrene ReidStaebler, Lorry Furber M (MD)  (Signed 16-Mar-16 00:06)  Authored: L&D Evaluation   Last Updated: 16-Mar-16 00:06 by Lorrene ReidStaebler, Jacky Dross M (MD)

## 2015-01-30 ENCOUNTER — Encounter (HOSPITAL_COMMUNITY): Payer: Self-pay | Admitting: Obstetrics and Gynecology
